# Patient Record
Sex: Male | Born: 2000 | Race: Black or African American | Hispanic: No | Marital: Single
Health system: Southern US, Community
[De-identification: ages and names within clinical notes are randomized; demographics above are authoritative.]

## PROBLEM LIST (undated history)

## (undated) ENCOUNTER — Emergency Department (HOSPITAL_COMMUNITY): Admission: EM | Payer: Medicaid Other | Source: Home / Self Care

---
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
## 2020-11-30 IMAGING — DX DG ANKLE COMPLETE 3+V*L*
3 series · 3 of 3 positions shown · non-contrast
Comparison: None.

CLINICAL DATA: Pain status post fall

EXAM:
LEFT ANKLE COMPLETE - 3+ VIEW

[ankle ap]
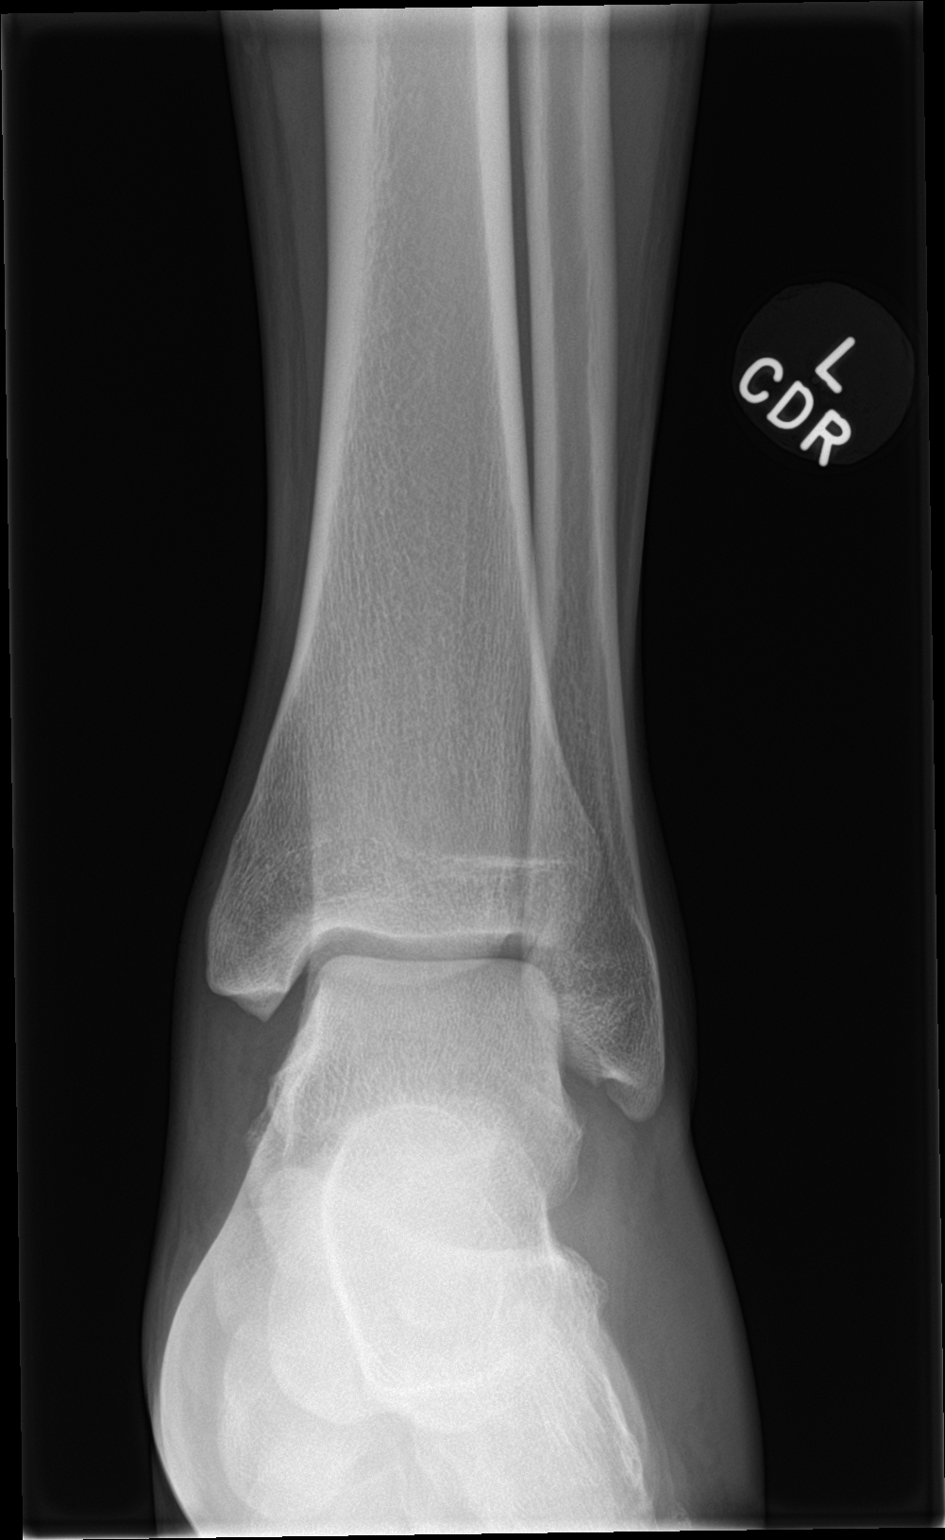

[ankle obl]
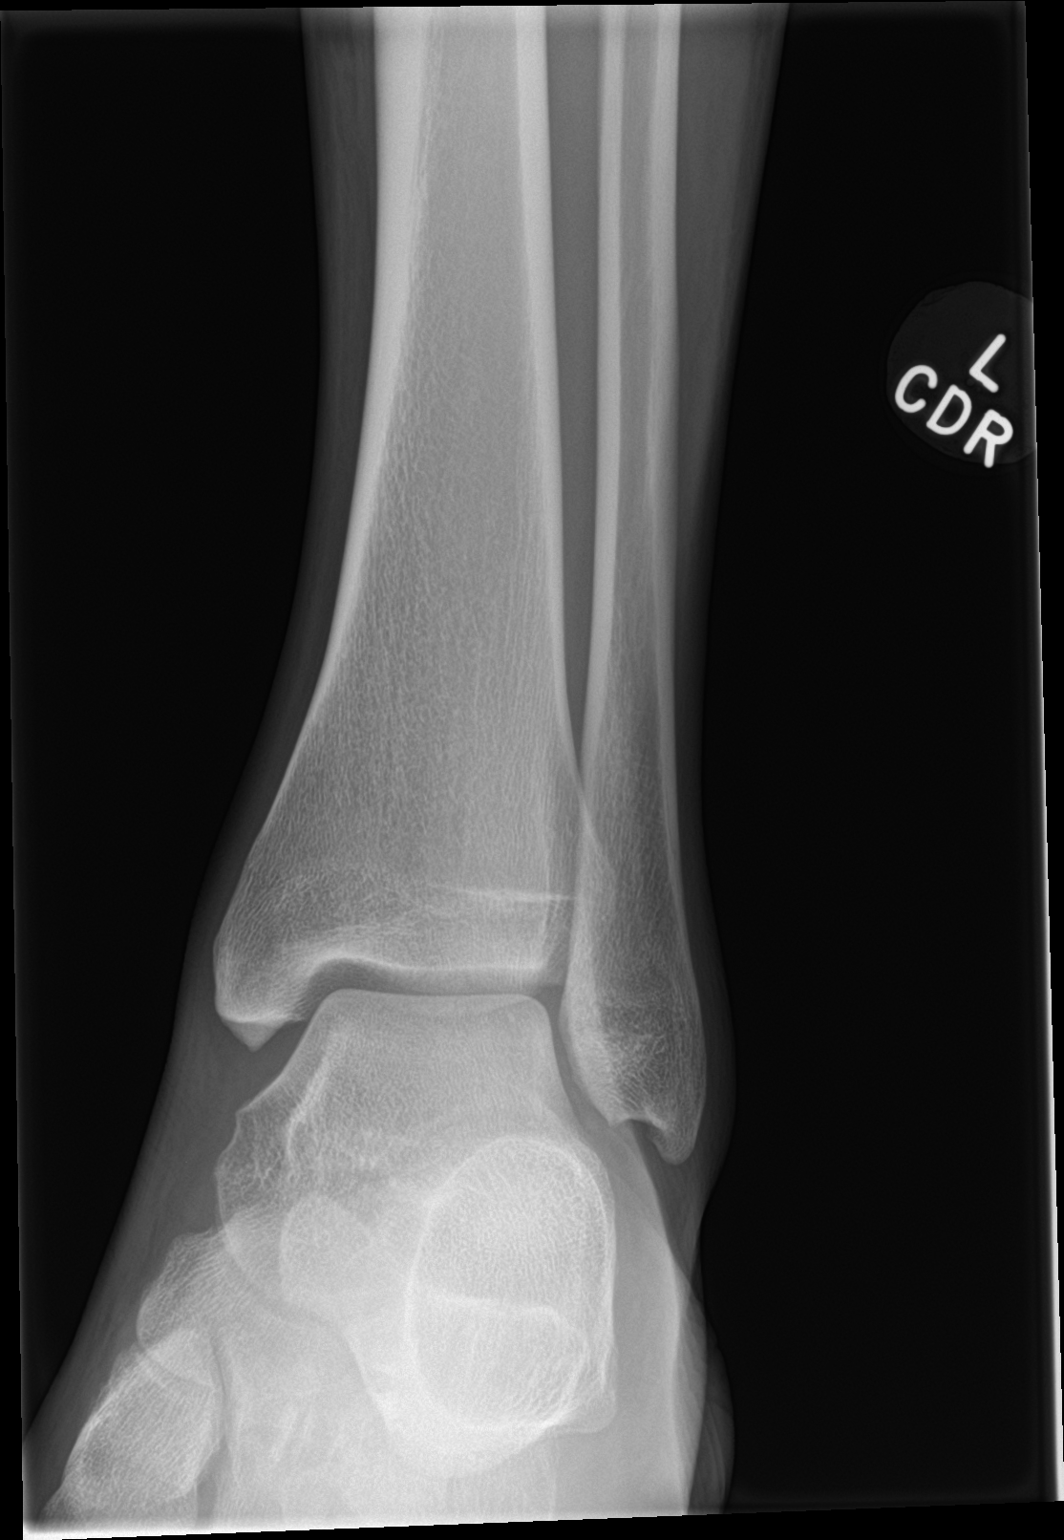

[ankle lat]
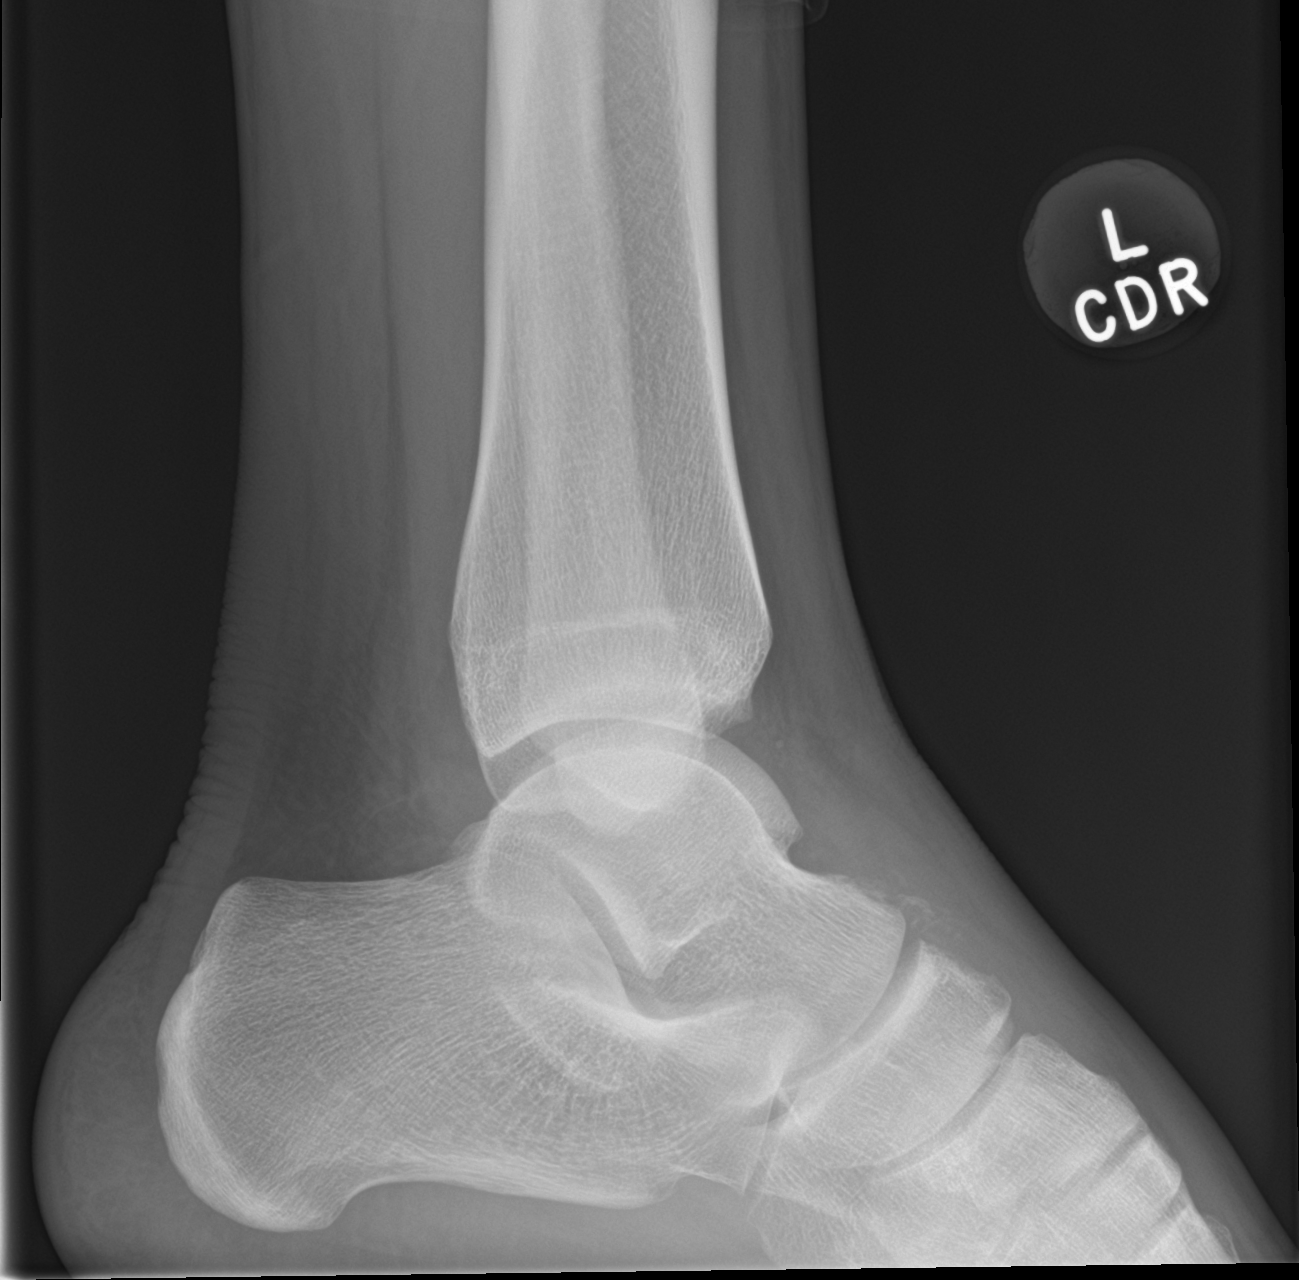

[3 of 3 positions shown; findings below may reference images not displayed]

FINDINGS: There is no acute displaced fracture or dislocation. There is some
fragmentation overlying the distal talus and proximal navicular,
likely related to an old remote injury.
IMPRESSION: No acute displaced fracture or dislocation.

## 2020-11-30 IMAGING — DX DG FOOT COMPLETE 3+V*L*
3 series · 3 of 3 positions shown · non-contrast
Comparison: None.

CLINICAL DATA: Pain

EXAM:
LEFT FOOT - COMPLETE 3+ VIEW

[foot ap]
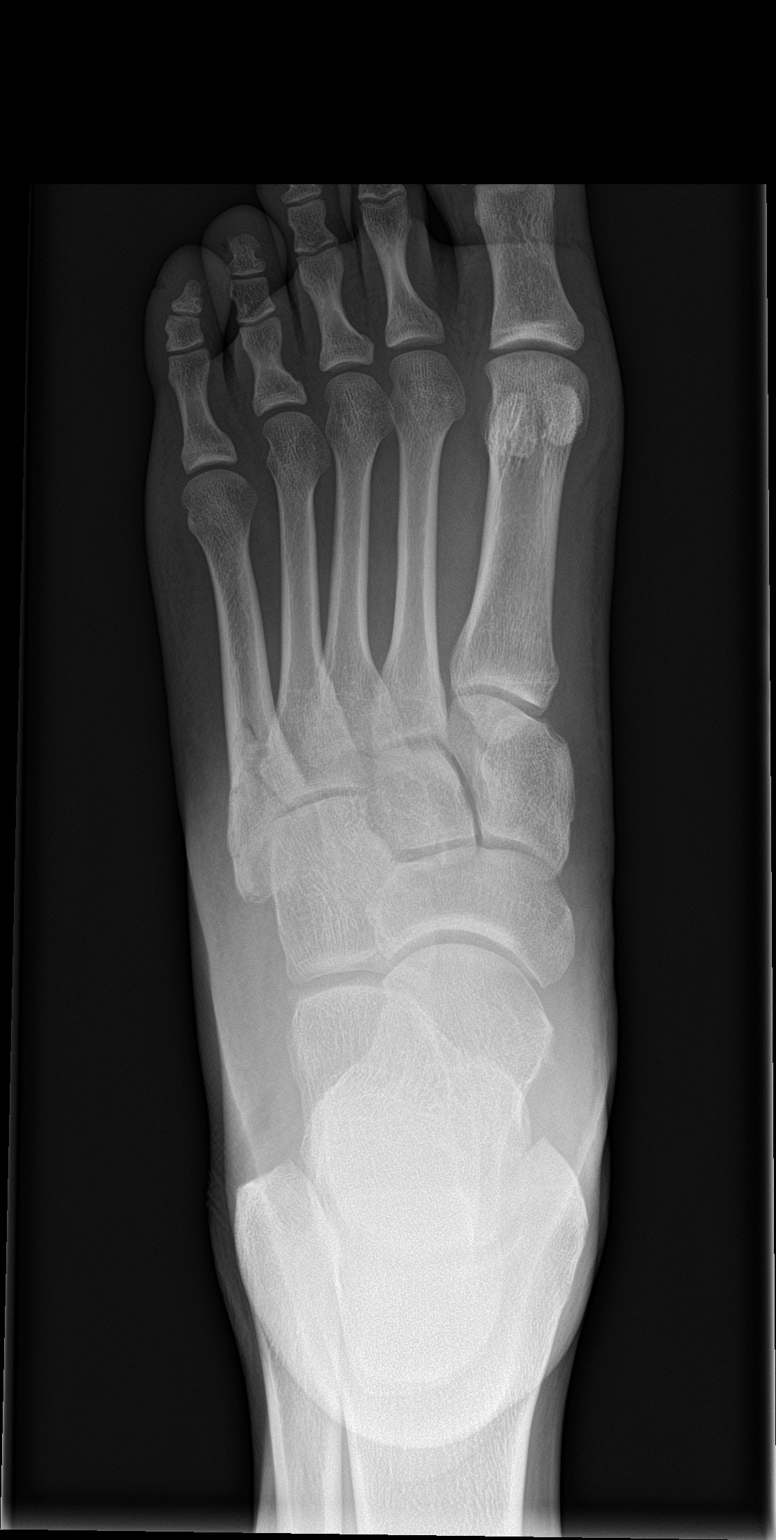

[foot obl]
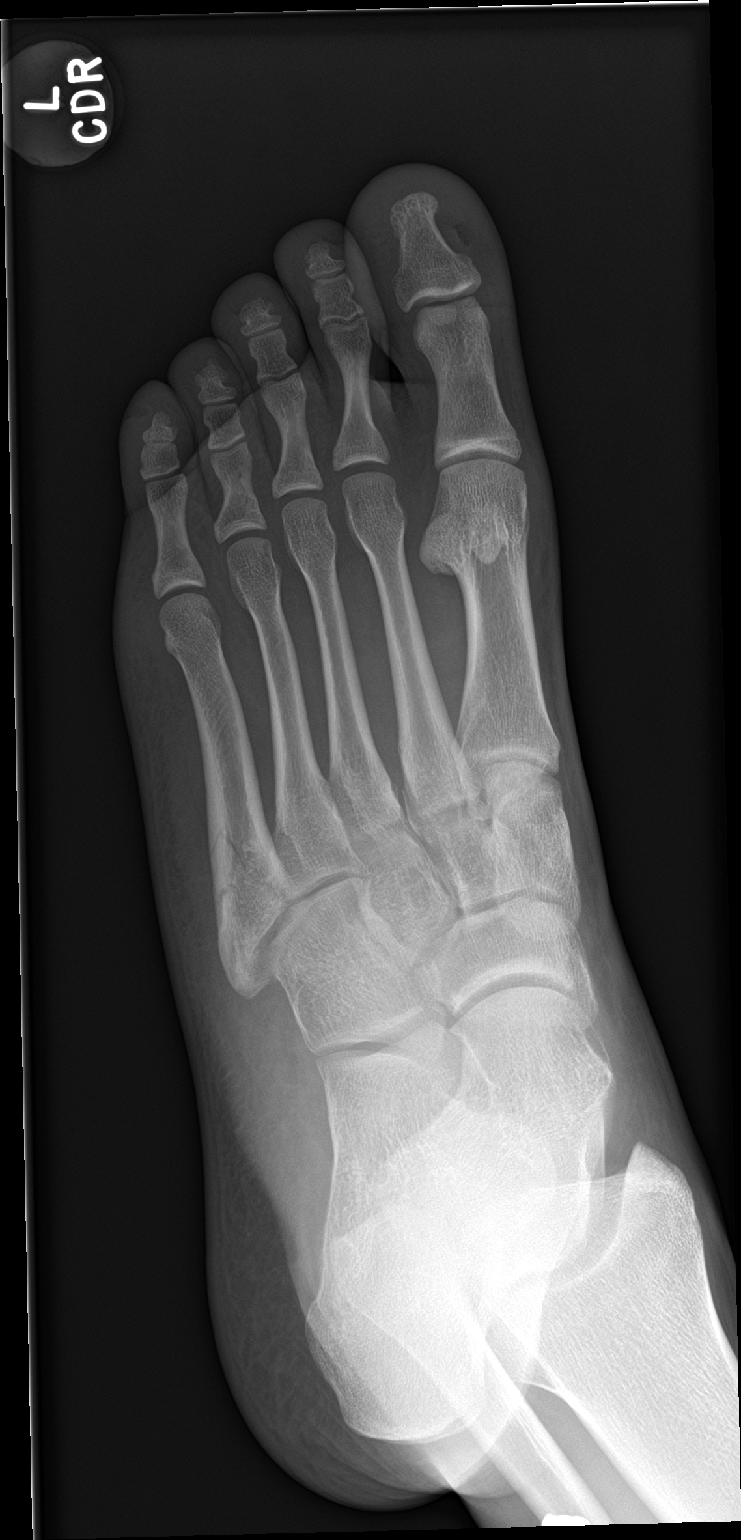

[foot lat]
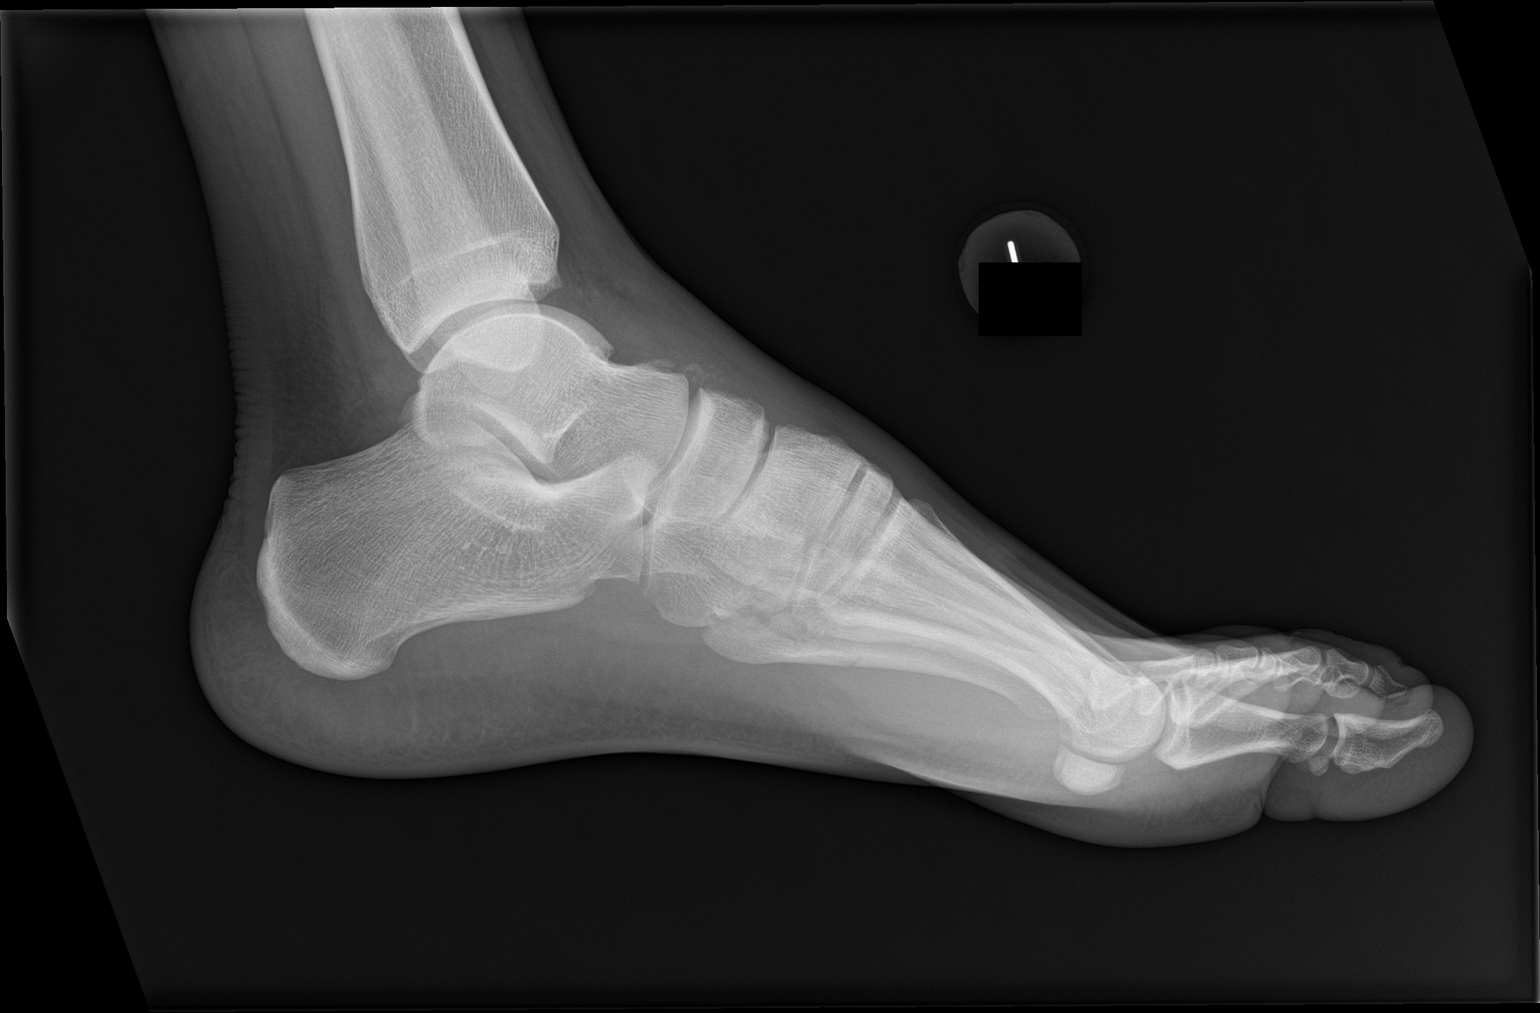

[3 of 3 positions shown; findings below may reference images not displayed]

FINDINGS: There is an acute nondisplaced fracture involving the proximal
aspect of the fifth metatarsal. There is surrounding soft tissue
swelling.
IMPRESSION: Acute nondisplaced fracture involving the proximal aspect of the
fifth metatarsal.

## 2022-01-19 ENCOUNTER — Emergency Department (HOSPITAL_BASED_OUTPATIENT_CLINIC_OR_DEPARTMENT_OTHER): Payer: Medicaid Other

## 2022-01-19 ENCOUNTER — Encounter (HOSPITAL_BASED_OUTPATIENT_CLINIC_OR_DEPARTMENT_OTHER): Payer: Self-pay | Admitting: Urology

## 2022-01-19 ENCOUNTER — Ambulatory Visit (HOSPITAL_BASED_OUTPATIENT_CLINIC_OR_DEPARTMENT_OTHER)
Admission: EM | Admit: 2022-01-19 | Discharge: 2022-01-20 | Disposition: A | Discharge status: Home / Self Care | Payer: Medicaid Other | Attending: General Surgery | Admitting: General Surgery

## 2022-01-19 ENCOUNTER — Other Ambulatory Visit: Payer: Self-pay

## 2022-01-19 DIAGNOSIS — K37 Unspecified appendicitis: Secondary | ICD-10-CM | POA: Diagnosis present

## 2022-01-19 DIAGNOSIS — K352 Acute appendicitis with generalized peritonitis, without perforation or abscess: Secondary | ICD-10-CM

## 2022-01-19 DIAGNOSIS — K3589 Other acute appendicitis without perforation or gangrene: Secondary | ICD-10-CM | POA: Diagnosis not present

## 2022-01-19 LAB — URINALYSIS, ROUTINE W REFLEX MICROSCOPIC
Bilirubin Urine: NEGATIVE
Glucose, UA: NEGATIVE mg/dL
Hgb urine dipstick: NEGATIVE
Ketones, ur: NEGATIVE mg/dL
Leukocytes,Ua: NEGATIVE
Nitrite: NEGATIVE
Protein, ur: NEGATIVE mg/dL
Specific Gravity, Urine: 1.02 (ref 1.005–1.030)
pH: 8.5 — ABNORMAL HIGH (ref 5.0–8.0)

## 2022-01-19 LAB — COMPREHENSIVE METABOLIC PANEL
ALT: 19 U/L (ref 0–44)
AST: 31 U/L (ref 15–41)
Albumin: 4.7 g/dL (ref 3.5–5.0)
Alkaline Phosphatase: 63 U/L (ref 38–126)
Anion gap: 14 (ref 5–15)
BUN: UNDETERMINED mg/dL (ref 6–20)
CO2: 20 mmol/L — ABNORMAL LOW (ref 22–32)
Calcium: 9.5 mg/dL (ref 8.9–10.3)
Chloride: 100 mmol/L (ref 98–111)
Creatinine, Ser: 1.05 mg/dL (ref 0.61–1.24)
GFR, Estimated: >60 mL/min (ref >60)
Glucose, Bld: 117 mg/dL — ABNORMAL HIGH (ref 70–99)
Potassium: 3.2 mmol/L — ABNORMAL LOW (ref 3.5–5.1)
Sodium: 134 mmol/L — ABNORMAL LOW (ref 135–145)
Total Bilirubin: 0.4 mg/dL (ref 0.3–1.2)
Total Protein: 8.9 g/dL — ABNORMAL HIGH (ref 6.5–8.1)

## 2022-01-19 LAB — CBC
HCT: 44.9 % (ref 39.0–52.0)
Hemoglobin: 15.3 g/dL (ref 13.0–17.0)
MCH: 30.9 pg (ref 26.0–34.0)
MCHC: 34.1 g/dL (ref 30.0–36.0)
MCV: 90.7 fL (ref 80.0–100.0)
Platelets: 200 10*3/uL (ref 150–400)
RBC: 4.95 MIL/uL (ref 4.22–5.81)
RDW: 12.1 % (ref 11.5–15.5)
WBC: 15.1 10*3/uL — ABNORMAL HIGH (ref 4.0–10.5)
nRBC: 0 % (ref 0.0–0.2)

## 2022-01-19 LAB — LIPASE, BLOOD: Lipase: 28 U/L (ref 11–51)

## 2022-01-19 MED ORDER — MORPHINE SULFATE (PF) 2 MG/ML IV SOLN
2.0000 mg | Freq: Once | INTRAVENOUS | Status: AC
  Administered 2022-01-19: 2 mg via INTRAVENOUS
  Filled 2022-01-19: qty 1

## 2022-01-19 MED ORDER — SODIUM CHLORIDE 0.9 % IV SOLN
2.0000 g | Freq: Once | INTRAVENOUS | Status: AC
  Administered 2022-01-19: 2 g via INTRAVENOUS
  Filled 2022-01-19: qty 20

## 2022-01-19 MED ORDER — METRONIDAZOLE 500 MG/100ML IV SOLN
500.0000 mg | Freq: Once | INTRAVENOUS | Status: AC
  Administered 2022-01-19: 500 mg via INTRAVENOUS
  Filled 2022-01-19: qty 100

## 2022-01-19 MED ORDER — IOHEXOL 300 MG/ML  SOLN
80.0000 mL | Freq: Once | INTRAMUSCULAR | Status: AC | PRN
  Administered 2022-01-19: 80 mL via INTRAVENOUS

## 2022-01-19 MED ORDER — SODIUM CHLORIDE 0.9 % IV BOLUS
1000.0000 mL | Freq: Once | INTRAVENOUS | Status: AC
  Administered 2022-01-19: 1000 mL via INTRAVENOUS

## 2022-01-19 MED ORDER — ONDANSETRON HCL 4 MG/2ML IJ SOLN
4.0000 mg | Freq: Once | INTRAMUSCULAR | Status: AC
  Administered 2022-01-19: 4 mg via INTRAVENOUS
  Filled 2022-01-19: qty 2

## 2022-01-19 MED ORDER — ONDANSETRON 4 MG PO TBDP
4.0000 mg | ORAL_TABLET | Freq: Once | ORAL | Status: AC | PRN
  Administered 2022-01-19: 4 mg via ORAL
  Filled 2022-01-19: qty 1

## 2022-01-19 MED ORDER — MORPHINE SULFATE (PF) 4 MG/ML IV SOLN
4.0000 mg | Freq: Once | INTRAVENOUS | Status: AC
  Administered 2022-01-19: 4 mg via INTRAVENOUS
  Filled 2022-01-19: qty 1

## 2022-01-19 NOTE — ED Provider Notes (Signed)
[facility] Provider Note   CSN: [id number] Arrival date & time: 01/19/22  1607     History  Chief Complaint  Patient presents with   Emesis    Daniel Rubio is a 22 y.o. male.  Patient with no pertinent past medical history presents today with complaints of nausea, vomiting, and abdominal pain.  He states that same began this morning.  He denies any history of similar previously.  Pain is located in the periumbilical region and radiates to his right lower quadrant.  Denies any associated diarrhea.  He has had a bowel movement today and is passing flatus normally.  Denies any history of abdominal surgeries.  Denies fevers or chills.  The history is provided by the patient. No language interpreter was used.  Emesis Associated symptoms: abdominal pain        Home Medications Prior to Admission medications   Medication Sig Start Date End Date Taking? Authorizing Provider  HYDROcodone-acetaminophen (NORCO/VICODIN) 5-325 MG tablet Take 1 tablet by mouth every 6 (six) hours as needed. 01/24/19   Caccavale, Sophia, PA-C      Allergies    Patient has no known allergies.    Review of Systems   Review of Systems  Gastrointestinal:  Positive for abdominal pain, nausea and vomiting.  All other systems reviewed and are negative.   Physical Exam Updated Vital Signs BP (!) 142/80   Pulse 89   Temp (!) 97.4 F (36.3 C) (Oral)   Resp 17   Ht 6\' 2"  (1.88 m)   Wt 72.6 kg   SpO2 100%   BMI 20.54 kg/m  Physical Exam Vitals and nursing note reviewed.  Constitutional:      General: He is not in acute distress.    Appearance: Normal appearance. He is normal weight. He is not ill-appearing, toxic-appearing or diaphoretic.  HENT:     Head: Normocephalic and atraumatic.  Cardiovascular:     Rate and Rhythm: Normal rate.  Pulmonary:     Effort: Pulmonary effort is normal. No respiratory distress.  Abdominal:     General: Abdomen is flat.      Palpations: Abdomen is soft.     Tenderness: There is abdominal tenderness. There is rebound.  Musculoskeletal:        General: Normal range of motion.     Cervical back: Normal range of motion.  Skin:    General: Skin is warm and dry.  Neurological:     General: No focal deficit present.     Mental Status: He is alert.  Psychiatric:        Mood and Affect: Mood normal.        Behavior: Behavior normal.    ED Results / Procedures / Treatments   Labs (all labs ordered are listed, but only abnormal results are displayed) Labs Reviewed  COMPREHENSIVE METABOLIC PANEL - Abnormal; Notable for the following components:      Result Value   Sodium 134 (*)    Potassium 3.2 (*)    CO2 20 (*)    Glucose, Bld 117 (*)    Total Protein 8.9 (*)    All other components within normal limits  CBC - Abnormal; Notable for the following components:   WBC 15.1 (*)    All other components within normal limits  URINALYSIS, ROUTINE W REFLEX MICROSCOPIC - Abnormal; Notable for the following components:   pH 8.5 (*)    All other components within normal limits  LIPASE, BLOOD  EKG None  Radiology CT ABDOMEN PELVIS W CONTRAST  Result Date: 01/19/2022 CLINICAL DATA:  Abdominal pain, nausea and vomiting EXAM: CT ABDOMEN AND PELVIS WITH CONTRAST TECHNIQUE: Multidetector CT imaging of the abdomen and pelvis was performed using the standard protocol following bolus administration of intravenous contrast. RADIATION DOSE REDUCTION: This exam was performed according to the departmental dose-optimization program which includes automated exposure control, adjustment of the mA and/or kV according to patient size and/or use of iterative reconstruction technique. CONTRAST:  70mL OMNIPAQUE IOHEXOL 300 MG/ML  SOLN COMPARISON:  None Available. FINDINGS: Lower chest: No acute pleural or parenchymal lung disease. Hepatobiliary: No focal liver abnormality is seen. No gallstones, gallbladder wall thickening, or biliary  dilatation. Pancreas: Unremarkable. No pancreatic ductal dilatation or surrounding inflammatory changes. Spleen: Normal in size without focal abnormality. Adrenals/Urinary Tract: Adrenal glands are unremarkable. Kidneys are normal, without renal calculi, focal lesion, or hydronephrosis. Bladder is unremarkable. Stomach/Bowel: There is a dilated inflamed appendix within the right lower quadrant, measuring up to 13 mm reference image 62/2. Appendicolith is identified, with appendiceal mucosal enhancement and mild periappendiceal fat stranding consistent with acute uncomplicated appendicitis. No perforation, fluid collection, or abscess. No bowel obstruction or ileus. Vascular/Lymphatic: No significant vascular findings are present. No enlarged abdominal or pelvic lymph nodes. Reproductive: Prostate is unremarkable. Other: No free fluid or free intraperitoneal gas. No abdominal wall hernia. Musculoskeletal: No acute or destructive bony lesions. Reconstructed images demonstrate no additional findings. IMPRESSION: 1. Acute uncomplicated appendicitis. No perforation, fluid collection, or abscess. Electronically Signed   By: Sharlet Salina M.D.   On: 01/19/2022 21:43    Procedures Procedures    Medications Ordered in ED Medications  ondansetron (ZOFRAN-ODT) disintegrating tablet 4 mg (4 mg Oral Given 01/19/22 1701)  sodium chloride 0.9 % bolus 1,000 mL (1,000 mLs Intravenous New Bag/Given 01/19/22 2117)  iohexol (OMNIPAQUE) 300 MG/ML solution 80 mL (80 mLs Intravenous Contrast Given 01/19/22 2135)    ED Course/ Medical Decision Making/ A&P                           Medical Decision Making Amount and/or Complexity of Data Reviewed Labs: ordered. Radiology: ordered.  Risk Prescription drug management. Decision regarding hospitalization.   This patient is a 22 y.o. male who presents to the ED for concern of abdominal pain, nausea, and vomiting, this involves an extensive number of treatment options, and is  a complaint that carries with it a high risk of complications and morbidity. The emergent differential diagnosis prior to evaluation includes, but is not limited to,  The differential diagnosis for generalized abdominal pain includes, but is not limited to AAA, gastroenteritis, appendicitis, Bowel obstruction, Bowel perforation. Gastroparesis, DKA, Hernia, Inflammatory bowel disease, mesenteric ischemia, pancreatitis, peritonitis SBP, volvulus.  This is not an exhaustive differential.   Past Medical History / Co-morbidities / Social History: none  Physical Exam: Physical exam performed. The pertinent findings include: Periumbilical and RLQ abdominal tenderness to palpation  Lab Tests: I ordered, and personally interpreted labs.  The pertinent results include:  Na 134, K 3.2, WBC 15.1.   Imaging Studies: I ordered imaging studies including CT abdomen pelvis. I independently visualized and interpreted imaging which showed   1. Acute uncomplicated appendicitis. No perforation, fluid collection, or abscess.  I agree with the radiologist interpretation.   Medications: I ordered medication including rocephin, flagyl, fluids, morphine, zofran  for appendicitis, dehydration, pain, nausea/vomiting. Reevaluation of the patient after these medicines showed that  the patient improved. I have reviewed the patients home medicines and have made adjustments as needed.  Consultations Obtained: I requested consultation with the general surgery Dr. Marlou Starks,  and discussed lab and imaging findings as well as pertinent plan - they recommend: transfer to [facility] for evaluation. NPO   Disposition:  Patient presents today with complaints of right lower quadrant pain with nausea and vomiting since this morning.  He is afebrile, nontoxic-appearing, and in no acute distress with reassuring vital signs.  CT imaging reveals acute appendicitis.  Discussed with general surgery who will see patient at [facility] long  ED.  Discussed plan with patient who is understanding and amenable.  Discussed transfer with [facility], EDP Dr. Regenia Skeeter who accepts patient for transfer.   I discussed this case with my attending physician Dr. Ronnald Nian who cosigned this note including patient's presenting symptoms, physical exam, and planned diagnostics and interventions. Attending physician stated agreement with plan or made changes to plan which were implemented.     Final Clinical Impression(s) / ED Diagnoses Final diagnoses:  Acute appendicitis with generalized peritonitis without gangrene, perforation, or abscess    Rx / DC Orders ED Discharge Orders     None         Nestor Lewandowsky 01/19/22 2247    Lennice Sites, DO 01/19/22 2254

## 2022-01-19 NOTE — ED Triage Notes (Signed)
Pt states abdominal pain since this am  N/V , denies diarrhea Denies fever

## 2022-01-19 NOTE — ED Notes (Signed)
Pt vomited green clear liquid x2.  Given water to rinse and spit and cool washcloth. Medicated. Mother remains at the bedside.  Pt ambulates to the bathroom to void.

## 2022-01-19 NOTE — ED Notes (Signed)
Triage RN attempted IV placement x2 without success, CT tech attempted IV placement without success.  RT to place [place] IV

## 2022-01-19 NOTE — ED Notes (Signed)
Epic DM to charge RN at [employer].  Pt aware that we are awaiting transport to [facility].

## 2022-01-19 NOTE — ED Notes (Signed)
Pt is in CT

## 2022-01-20 ENCOUNTER — Emergency Department (HOSPITAL_COMMUNITY): Payer: Medicaid Other | Admitting: Anesthesiology

## 2022-01-20 ENCOUNTER — Emergency Department (HOSPITAL_BASED_OUTPATIENT_CLINIC_OR_DEPARTMENT_OTHER): Payer: Medicaid Other | Admitting: Anesthesiology

## 2022-01-20 ENCOUNTER — Encounter (HOSPITAL_COMMUNITY)
Admission: EM | Disposition: A | Discharge status: Home / Self Care | Payer: Self-pay | Source: Home / Self Care | Attending: Emergency Medicine

## 2022-01-20 ENCOUNTER — Other Ambulatory Visit: Payer: Self-pay

## 2022-01-20 DIAGNOSIS — K3580 Unspecified acute appendicitis: Secondary | ICD-10-CM

## 2022-01-20 DIAGNOSIS — K37 Unspecified appendicitis: Secondary | ICD-10-CM | POA: Diagnosis present

## 2022-01-20 HISTORY — PX: LAPAROSCOPIC APPENDECTOMY: SHX408

## 2022-01-20 SURGERY — APPENDECTOMY, LAPAROSCOPIC
Anesthesia: General | Site: Abdomen

## 2022-01-20 MED ORDER — FENTANYL CITRATE (PF) 100 MCG/2ML IJ SOLN
INTRAMUSCULAR | Status: AC
  Filled 2022-01-20: qty 2

## 2022-01-20 MED ORDER — MIDAZOLAM HCL 2 MG/2ML IJ SOLN
INTRAMUSCULAR | Status: AC
  Filled 2022-01-20: qty 2

## 2022-01-20 MED ORDER — LACTATED RINGERS IV SOLN: INTRAVENOUS | Status: DC | PRN

## 2022-01-20 MED ORDER — OXYCODONE HCL 5 MG PO TABS: 5.0000 mg | ORAL_TABLET | ORAL | Status: DC | PRN

## 2022-01-20 MED ORDER — ACETAMINOPHEN 10 MG/ML IV SOLN
INTRAVENOUS | Status: DC | PRN
  Administered 2022-01-20: 1000 mg via INTRAVENOUS

## 2022-01-20 MED ORDER — LACTATED RINGERS IR SOLN
Status: DC | PRN
  Administered 2022-01-20: 1000 mL

## 2022-01-20 MED ORDER — DEXAMETHASONE SODIUM PHOSPHATE 10 MG/ML IJ SOLN
INTRAMUSCULAR | Status: AC
  Filled 2022-01-20: qty 1

## 2022-01-20 MED ORDER — ROCURONIUM BROMIDE 10 MG/ML (PF) SYRINGE
PREFILLED_SYRINGE | INTRAVENOUS | Status: DC | PRN
  Administered 2022-01-20: 40 mg via INTRAVENOUS

## 2022-01-20 MED ORDER — ONDANSETRON HCL 4 MG/2ML IJ SOLN: 4.0000 mg | Freq: Four times a day (QID) | INTRAMUSCULAR | Status: DC | PRN

## 2022-01-20 MED ORDER — MORPHINE SULFATE (PF) 4 MG/ML IV SOLN
4.0000 mg | Freq: Once | INTRAVENOUS | Status: AC
  Administered 2022-01-20: 4 mg via INTRAVENOUS
  Filled 2022-01-20: qty 1

## 2022-01-20 MED ORDER — OXYCODONE HCL 5 MG PO TABS
ORAL_TABLET | ORAL | Status: AC
  Filled 2022-01-20: qty 1

## 2022-01-20 MED ORDER — SUCCINYLCHOLINE CHLORIDE 200 MG/10ML IV SOSY
PREFILLED_SYRINGE | INTRAVENOUS | Status: DC | PRN
  Administered 2022-01-20: 140 mg via INTRAVENOUS

## 2022-01-20 MED ORDER — MIDAZOLAM HCL 2 MG/2ML IJ SOLN: 0.5000 mg | Freq: Once | INTRAMUSCULAR | Status: DC | PRN

## 2022-01-20 MED ORDER — ONDANSETRON HCL 4 MG/2ML IJ SOLN
INTRAMUSCULAR | Status: AC
  Filled 2022-01-20: qty 2

## 2022-01-20 MED ORDER — SODIUM CHLORIDE 0.9 % IV SOLN: INTRAVENOUS | Status: DC

## 2022-01-20 MED ORDER — PANTOPRAZOLE SODIUM 40 MG IV SOLR: 40.0000 mg | Freq: Every day | INTRAVENOUS | Status: DC

## 2022-01-20 MED ORDER — PROPOFOL 10 MG/ML IV BOLUS
INTRAVENOUS | Status: DC | PRN
  Administered 2022-01-20: 200 mg via INTRAVENOUS

## 2022-01-20 MED ORDER — 0.9 % SODIUM CHLORIDE (POUR BTL) OPTIME
TOPICAL | Status: DC | PRN
  Administered 2022-01-20: 1000 mL

## 2022-01-20 MED ORDER — DEXTROSE 5 % IV SOLN
INTRAVENOUS | Status: DC | PRN
  Administered 2022-01-20: 2 g via INTRAVENOUS

## 2022-01-20 MED ORDER — PROMETHAZINE HCL 25 MG/ML IJ SOLN: 6.2500 mg | INTRAMUSCULAR | Status: DC | PRN

## 2022-01-20 MED ORDER — METHOCARBAMOL 500 MG PO TABS: 500.0000 mg | ORAL_TABLET | Freq: Four times a day (QID) | ORAL | Status: DC | PRN

## 2022-01-20 MED ORDER — HYDROMORPHONE HCL 1 MG/ML IJ SOLN: 0.2500 mg | INTRAMUSCULAR | Status: DC | PRN

## 2022-01-20 MED ORDER — PROPOFOL 10 MG/ML IV BOLUS
INTRAVENOUS | Status: AC
  Filled 2022-01-20: qty 20

## 2022-01-20 MED ORDER — OXYCODONE HCL 5 MG PO TABS
5.0000 mg | ORAL_TABLET | Freq: Once | ORAL | Status: AC | PRN
  Administered 2022-01-20: 5 mg via ORAL

## 2022-01-20 MED ORDER — SODIUM CHLORIDE 0.9 % IV SOLN
INTRAVENOUS | Status: AC
  Filled 2022-01-20: qty 20

## 2022-01-20 MED ORDER — ACETAMINOPHEN 10 MG/ML IV SOLN
INTRAVENOUS | Status: AC
  Filled 2022-01-20: qty 100

## 2022-01-20 MED ORDER — OXYCODONE HCL 5 MG/5ML PO SOLN: 5.0000 mg | Freq: Once | ORAL | Status: AC | PRN

## 2022-01-20 MED ORDER — FENTANYL CITRATE (PF) 100 MCG/2ML IJ SOLN
INTRAMUSCULAR | Status: DC | PRN
  Administered 2022-01-20: 50 ug via INTRAVENOUS
  Administered 2022-01-20: 100 ug via INTRAVENOUS

## 2022-01-20 MED ORDER — METRONIDAZOLE 500 MG/100ML IV SOLN
INTRAVENOUS | Status: AC
  Filled 2022-01-20: qty 100

## 2022-01-20 MED ORDER — SUGAMMADEX SODIUM 200 MG/2ML IV SOLN
INTRAVENOUS | Status: DC | PRN
  Administered 2022-01-20: 150 mg via INTRAVENOUS

## 2022-01-20 MED ORDER — ONDANSETRON 4 MG PO TBDP: 4.0000 mg | ORAL_TABLET | Freq: Four times a day (QID) | ORAL | Status: DC | PRN

## 2022-01-20 MED ORDER — DEXAMETHASONE SODIUM PHOSPHATE 10 MG/ML IJ SOLN
INTRAMUSCULAR | Status: DC | PRN
  Administered 2022-01-20: 5 mg via INTRAVENOUS

## 2022-01-20 MED ORDER — BUPIVACAINE-EPINEPHRINE (PF) 0.5% -1:200000 IJ SOLN
INTRAMUSCULAR | Status: AC
  Filled 2022-01-20: qty 30

## 2022-01-20 MED ORDER — LIDOCAINE HCL (PF) 2 % IJ SOLN
INTRAMUSCULAR | Status: AC
  Filled 2022-01-20: qty 5

## 2022-01-20 MED ORDER — SUCCINYLCHOLINE CHLORIDE 200 MG/10ML IV SOSY
PREFILLED_SYRINGE | INTRAVENOUS | Status: AC
  Filled 2022-01-20: qty 10

## 2022-01-20 MED ORDER — MORPHINE SULFATE (PF) 2 MG/ML IV SOLN: 1.0000 mg | INTRAVENOUS | Status: DC | PRN

## 2022-01-20 MED ORDER — ROCURONIUM BROMIDE 10 MG/ML (PF) SYRINGE
PREFILLED_SYRINGE | INTRAVENOUS | Status: AC
  Filled 2022-01-20: qty 10

## 2022-01-20 MED ORDER — METRONIDAZOLE 500 MG/100ML IV SOLN
INTRAVENOUS | Status: DC | PRN
  Administered 2022-01-20: 500 mg via INTRAVENOUS

## 2022-01-20 MED ORDER — ONDANSETRON HCL 4 MG/2ML IJ SOLN
4.0000 mg | Freq: Once | INTRAMUSCULAR | Status: AC
  Administered 2022-01-20: 4 mg via INTRAVENOUS
  Filled 2022-01-20: qty 2

## 2022-01-20 MED ORDER — LIDOCAINE 2% (20 MG/ML) 5 ML SYRINGE
INTRAMUSCULAR | Status: DC | PRN
  Administered 2022-01-20: 40 mg via INTRAVENOUS

## 2022-01-20 MED ORDER — ENOXAPARIN SODIUM 30 MG/0.3ML IJ SOSY: 30.0000 mg | PREFILLED_SYRINGE | INTRAMUSCULAR | Status: DC

## 2022-01-20 MED ORDER — OXYCODONE HCL 5 MG PO TABS: 5.0000 mg | ORAL_TABLET | Freq: Four times a day (QID) | ORAL | 0 refills | Status: AC | PRN

## 2022-01-20 MED ORDER — MEPERIDINE HCL 50 MG/ML IJ SOLN: 6.2500 mg | INTRAMUSCULAR | Status: DC | PRN

## 2022-01-20 MED ORDER — MIDAZOLAM HCL 2 MG/2ML IJ SOLN
INTRAMUSCULAR | Status: DC | PRN
  Administered 2022-01-20: 2 mg via INTRAVENOUS

## 2022-01-20 MED ORDER — BUPIVACAINE-EPINEPHRINE 0.25% -1:200000 IJ SOLN
INTRAMUSCULAR | Status: DC | PRN
  Administered 2022-01-20: 20 mL

## 2022-01-20 MED ORDER — ONDANSETRON HCL 4 MG/2ML IJ SOLN
INTRAMUSCULAR | Status: DC | PRN
  Administered 2022-01-20: 4 mg via INTRAVENOUS

## 2022-01-20 SURGICAL SUPPLY — 40 items
ADH SKN CLS APL DERMABOND .7 (GAUZE/BANDAGES/DRESSINGS) ×1
APPLIER CLIP ROT 10 11.4 M/L (STAPLE)
APR CLP MED LRG 11.4X10 (STAPLE)
BAG COUNTER SPONGE SURGICOUNT (BAG) IMPLANT
CABLE HIGH FREQUENCY MONO STRZ (ELECTRODE) ×1 IMPLANT
CHLORAPREP W/TINT 26 (MISCELLANEOUS) ×1 IMPLANT
CLIP APPLIE ROT 10 11.4 M/L (STAPLE) IMPLANT
CUTTER FLEX LINEAR 45M (STAPLE) IMPLANT
DERMABOND ADVANCED .7 DNX12 (GAUZE/BANDAGES/DRESSINGS) ×1 IMPLANT
ELECT REM PT RETURN 15FT ADLT (MISCELLANEOUS) ×1 IMPLANT
ENDOLOOP SUT PDS II  0 18 (SUTURE)
ENDOLOOP SUT PDS II 0 18 (SUTURE) IMPLANT
GLOVE BIO SURGEON STRL SZ7.5 (GLOVE) ×1 IMPLANT
GOWN STRL REUS W/ TWL LRG LVL3 (GOWN DISPOSABLE) IMPLANT
GOWN STRL REUS W/TWL LRG LVL3 (GOWN DISPOSABLE)
IRRIG SUCT STRYKERFLOW 2 WTIP (MISCELLANEOUS) ×1
IRRIGATION SUCT STRKRFLW 2 WTP (MISCELLANEOUS) ×1 IMPLANT
KIT BASIN OR (CUSTOM PROCEDURE TRAY) ×1 IMPLANT
KIT TURNOVER KIT A (KITS) IMPLANT
PENCIL SMOKE EVACUATOR (MISCELLANEOUS) IMPLANT
RELOAD 45 VASCULAR/THIN (ENDOMECHANICALS) IMPLANT
RELOAD STAPLE 45 2.5 WHT GRN (ENDOMECHANICALS) IMPLANT
RELOAD STAPLE 45 3.5 BLU ETS (ENDOMECHANICALS) IMPLANT
RELOAD STAPLE 60 BLK VRY/THCK (STAPLE) IMPLANT
RELOAD STAPLE TA45 3.5 REG BLU (ENDOMECHANICALS) IMPLANT
RELOAD STAPLER 60MM BLK (STAPLE) ×1 IMPLANT
SCISSORS LAP 5X35 DISP (ENDOMECHANICALS) ×1 IMPLANT
SET TUBE SMOKE EVAC HIGH FLOW (TUBING) ×1 IMPLANT
SHEARS HARMONIC ACE PLUS 36CM (ENDOMECHANICALS) ×1 IMPLANT
SPIKE FLUID TRANSFER (MISCELLANEOUS) ×1 IMPLANT
STAPLE ECHEON FLEX 60 POW ENDO (STAPLE) IMPLANT
STAPLER RELOAD 60MM BLK (STAPLE) ×1
SUT MNCRL AB 4-0 PS2 18 (SUTURE) ×1 IMPLANT
SYS BAG RETRIEVAL 10MM (BASKET) ×1
SYSTEM BAG RETRIEVAL 10MM (BASKET) ×1 IMPLANT
TOWEL OR 17X26 10 PK STRL BLUE (TOWEL DISPOSABLE) ×1 IMPLANT
TRAY FOLEY MTR SLVR 16FR STAT (SET/KITS/TRAYS/PACK) IMPLANT
TRAY LAPAROSCOPIC (CUSTOM PROCEDURE TRAY) ×1 IMPLANT
TROCAR BALLN 12MMX100 BLUNT (TROCAR) ×1 IMPLANT
TROCAR Z-THREAD OPTICAL 5X100M (TROCAR) ×1 IMPLANT

## 2022-01-20 NOTE — H&P (Signed)
Daniel Rubio is an 22 y.o. male.   Chief Complaint: abd pain HPI: The patient is a 22 year old black male who began having some abdominal discomfort on Thursday night.  The pain worsened on Friday and he went to the emergency department.  The pain was associated with some nausea and vomiting.  He describes the pain more as epigastric but he does have some mild right lower quadrant tenderness as well.  He denies any fevers or chills.  A CT scan was consistent with acute appendicitis.  He is otherwise in good health.  He does occasionally smoke.  History reviewed. No pertinent past medical history.  History reviewed. No pertinent surgical history.  History reviewed. No pertinent family history. Social History:  reports that he has never smoked. He has never used smokeless tobacco. He reports current alcohol use. He reports current drug use. Drug: Marijuana.  Allergies: No Known Allergies  (Not in a hospital admission)   Results for orders placed or performed during the hospital encounter of 01/19/22 (from the past 48 hour(s))  Lipase, blood     Status: None   Collection Time: 01/19/22  4:57 PM  Result Value Ref Range   Lipase 28 11 - 51 U/L    Comment: Performed at [facility], [address] [place], [place] [address]  Comprehensive metabolic panel     Status: Abnormal   Collection Time: 01/19/22  4:57 PM  Result Value Ref Range   Sodium 134 (L) 135 - 145 mmol/L   Potassium 3.2 (L) 3.5 - 5.1 mmol/L   Chloride 100 98 - 111 mmol/L   CO2 20 (L) 22 - 32 mmol/L   Glucose, Bld 117 (H) 70 - 99 mg/dL    Comment: Glucose reference range applies only to samples taken after fasting for at least 8 hours.   BUN QUANTITY NOT SUFFICIENT, UNABLE TO PERFORM TEST 6 - 20 mg/dL    Comment:  PER GOUGE,S RN @1744  1.5.24 EDENSCA   Creatinine, Ser 1.05 0.61 - 1.24 mg/dL   Calcium 9.5 8.9 - 10.3 mg/dL   Total Protein 8.9 (H) 6.5 - 8.1 g/dL   Albumin 4.7 3.5 - 5.0 g/dL   AST 31 15 - 41 U/L    ALT 19 0 - 44 U/L   Alkaline Phosphatase 63 38 - 126 U/L   Total Bilirubin 0.4 0.3 - 1.2 mg/dL   GFR, Estimated >60 >60 mL/min    Comment: (NOTE) Calculated using the CKD-EPI Creatinine Equation (2021)    Anion gap 14 5 - 15    Comment: Performed at [facility], [address] [place], [place] [address]  CBC     Status: Abnormal   Collection Time: 01/19/22  4:57 PM  Result Value Ref Range   WBC 15.1 (H) 4.0 - 10.5 K/uL   RBC 4.95 4.22 - 5.81 MIL/uL   Hemoglobin 15.3 13.0 - 17.0 g/dL   HCT 44.9 39.0 - 52.0 %   MCV 90.7 80.0 - 100.0 fL   MCH 30.9 26.0 - 34.0 pg   MCHC 34.1 30.0 - 36.0 g/dL   RDW 12.1 11.5 - 15.5 %   Platelets 200 150 - 400 K/uL   nRBC 0.0 0.0 - 0.2 %    Comment: Performed at [facility], [address] [place], [place] [address]  Urinalysis, Routine w reflex microscopic     Status: Abnormal   Collection Time: 01/19/22  8:40 PM  Result Value Ref  Range   Color, Urine YELLOW YELLOW   APPearance CLEAR CLEAR   Specific Gravity, Urine 1.020 1.005 - 1.030   pH 8.5 (H) 5.0 - 8.0   Glucose, UA NEGATIVE NEGATIVE mg/dL   Hgb urine dipstick NEGATIVE NEGATIVE   Bilirubin Urine NEGATIVE NEGATIVE   Ketones, ur NEGATIVE NEGATIVE mg/dL   Protein, ur NEGATIVE NEGATIVE mg/dL   Nitrite NEGATIVE NEGATIVE   Leukocytes,Ua NEGATIVE NEGATIVE    Comment: Microscopic not done on urines with negative protein, blood, leukocytes, nitrite, or glucose < 500 mg/dL. Performed at [facility], [address] [place], [place] [address]    CT ABDOMEN PELVIS W CONTRAST  Result Date: 01/19/2022 CLINICAL DATA:  Abdominal pain, nausea and vomiting EXAM: CT ABDOMEN AND PELVIS WITH CONTRAST TECHNIQUE: Multidetector CT imaging of the abdomen and pelvis was performed using the standard protocol following bolus administration of intravenous contrast. RADIATION DOSE REDUCTION: This exam was performed according to the departmental dose-optimization program which  includes automated exposure control, adjustment of the mA and/or kV according to patient size and/or use of iterative reconstruction technique. CONTRAST:  63mL OMNIPAQUE IOHEXOL 300 MG/ML  SOLN COMPARISON:  None Available. FINDINGS: Lower chest: No acute pleural or parenchymal lung disease. Hepatobiliary: No focal liver abnormality is seen. No gallstones, gallbladder wall thickening, or biliary dilatation. Pancreas: Unremarkable. No pancreatic ductal dilatation or surrounding inflammatory changes. Spleen: Normal in size without focal abnormality. Adrenals/Urinary Tract: Adrenal glands are unremarkable. Kidneys are normal, without renal calculi, focal lesion, or hydronephrosis. Bladder is unremarkable. Stomach/Bowel: There is a dilated inflamed appendix within the right lower quadrant, measuring up to 13 mm reference image 62/2. Appendicolith is identified, with appendiceal mucosal enhancement and mild periappendiceal fat stranding consistent with acute uncomplicated appendicitis. No perforation, fluid collection, or abscess. No bowel obstruction or ileus. Vascular/Lymphatic: No significant vascular findings are present. No enlarged abdominal or pelvic lymph nodes. Reproductive: Prostate is unremarkable. Other: No free fluid or free intraperitoneal gas. No abdominal wall hernia. Musculoskeletal: No acute or destructive bony lesions. Reconstructed images demonstrate no additional findings. IMPRESSION: 1. Acute uncomplicated appendicitis. No perforation, fluid collection, or abscess. Electronically Signed   By: Sharlet Salina M.D.   On: 01/19/2022 21:43    Review of Systems  Constitutional: Negative.   HENT: Negative.    Eyes: Negative.   Respiratory: Negative.    Cardiovascular: Negative.   Gastrointestinal:  Positive for abdominal pain, nausea and vomiting.  Endocrine: Negative.   Genitourinary: Negative.   Musculoskeletal: Negative.   Allergic/Immunologic: Negative.   Neurological: Negative.    Hematological: Negative.   Psychiatric/Behavioral: Negative.      Blood pressure 122/67, pulse 77, temperature 99.9 F (37.7 C), temperature source Oral, resp. rate 16, height 6\' 2"  (1.88 m), weight 72.6 kg, SpO2 99 %. Physical Exam Vitals reviewed.  Constitutional:      General: He is not in acute distress.    Appearance: Normal appearance. He is normal weight.  HENT:     Head: Normocephalic and atraumatic.     Right Ear: External ear normal.     Left Ear: External ear normal.     Nose: Nose normal.     Mouth/Throat:     Mouth: Mucous membranes are moist.     Pharynx: Oropharynx is clear.  Eyes:     General: No scleral icterus.    Extraocular Movements: Extraocular movements intact.     Conjunctiva/sclera: Conjunctivae normal.     Pupils: Pupils are equal, round, and reactive to  light.  Cardiovascular:     Rate and Rhythm: Normal rate and regular rhythm.     Pulses: Normal pulses.     Heart sounds: Normal heart sounds.  Abdominal:     General: Abdomen is flat. Bowel sounds are normal.     Palpations: Abdomen is soft.     Comments: There is mild tenderness RLQ  Musculoskeletal:        General: No swelling or deformity. Normal range of motion.     Cervical back: Normal range of motion and neck supple.  Skin:    General: Skin is warm and dry.  Neurological:     General: No focal deficit present.     Mental Status: He is alert and oriented to person, place, and time.  Psychiatric:        Mood and Affect: Mood normal.        Behavior: Behavior normal.      Assessment/Plan The patient appears to have acute appendicitis.  Because of the risk of perforation and sepsis I feel he would benefit from having his appendix removed.  He would also like to have this done.  I have discussed with him in detail the risks and benefits of the operation as well as some of the technical aspects and he understands and wishes to proceed.  We will start him on broad-spectrum antibiotic therapy  and plan for surgery this morning  Chevis Pretty III, MD 01/20/2022, 6:28 AM

## 2022-01-20 NOTE — Progress Notes (Signed)
Pt discharged. Discharge instructions given. Pt wheeled down to waiting area with pt's mother.

## 2022-01-20 NOTE — ED Provider Notes (Signed)
Patient transferred from [facility] for acute appendicitis on imaging.  He has been NPO.  At time of arrival to the [facility] emergency department patient was pain-free.  He was placed on maintenance IV fluids.  Surgery notified of patient's arrival.  During his ED stay he did develop some recurrent pain, episode of emesis that was treated with additional morphine and ondansetron.   [name], MD 01/20/22 [phone]

## 2022-01-20 NOTE — Transfer of Care (Signed)
Immediate Anesthesia Transfer of Care Note  Patient: Daniel Rubio  Procedure(s) Performed: APPENDECTOMY LAPAROSCOPIC (Abdomen)  Patient Location: PACU  Anesthesia Type:General  Level of Consciousness: awake, alert , and oriented  Airway & Oxygen Therapy: Patient Spontanous Breathing and Patient connected to face mask oxygen  Post-op Assessment: Report given to RN, Post -op Vital signs reviewed and stable, and Patient moving all extremities X 4  Post vital signs: Reviewed and stable  Last Vitals:  Vitals Value Taken Time  BP 127/63   Temp    Pulse 102 01/20/22 0854  Resp 20 01/20/22 0854  SpO2 100 % 01/20/22 0854  Vitals shown include unvalidated device data.  Last Pain:  Vitals:   01/20/22 0630  TempSrc: Oral  PainSc:          Complications: No notable events documented.

## 2022-01-20 NOTE — Op Note (Signed)
01/19/2022 - 01/20/2022  8:46 AM  PATIENT:  Daniel Rubio  22 y.o. male  PRE-OPERATIVE DIAGNOSIS:  ACUTE APPENDICITIS  POST-OPERATIVE DIAGNOSIS:  ACUTE APPENDICITIS  PROCEDURE:  Procedure(s): APPENDECTOMY LAPAROSCOPIC (N/A)  SURGEON:  Surgeon(s) and Role:    * Griselda Miner, MD - Primary  PHYSICIAN ASSISTANT:   ASSISTANTS: none   ANESTHESIA:   local and general  EBL:  minimal   BLOOD ADMINISTERED:none  DRAINS: none   LOCAL MEDICATIONS USED:  MARCAINE     SPECIMEN:  Source of Specimen:  appendix  DISPOSITION OF SPECIMEN:  PATHOLOGY  COUNTS:  YES  TOURNIQUET:  * No tourniquets in log *  DICTATION: .Dragon Dictation  After informed consent was obtained patient was brought to the operating room placed in the supine position on the operating room table. After adequate induction of general anesthesia the patient's abdomen was prepped with ChloraPrep, allowed to dry, and draped in usual sterile manner. The area below the umbilicus was infiltrated with quarter percent Marcaine. A small incision was made with a 15 blade knife. This incision was carried down through the subcutaneous tissue bluntly with a hemostat and [employer] retractors until the linea alba was identified. The linea alba was incised with a 15 blade knife. Each side was grasped Coker clamps and elevated anteriorly. The preperitoneal space was probed bluntly with a hemostat until the peritoneum was opened and access was gained to the abdominal cavity. A 0 Vicryl purse string stitch was placed in the fascia surrounding the opening. A Hassan cannula was placed through the opening and anchored in place with the previously placed Vicryl purse string stitch. The laparoscope was placed through the [facility] cannula. The abdomen was insufflated with carbon dioxide without difficulty. Next the suprapubic area was infiltrated with quarter percent Marcaine. A small incision was made with a 15 blade knife. A 5 mm port was placed  bluntly through this incision into the abdominal cavity. A site was then chosen between the 2 port for placement of a 5 mm port. The area was infiltrated with quarter percent Marcaine. A small stab incision was made with a 15 blade knife. A 5 mm port was placed bluntly through this incision and the abdominal cavity under direct vision. The laparoscope was then moved to the suprapubic port. Using a Glassman grasper and harmonic scalpel the right lower quadrant was inspected. The appendix was readily identified. The appendix was elevated anteriorly and the mesoappendix was taken down sharply with the harmonic scalpel. Once the base of the appendix where it joined the cecum was identified and cleared of any tissue then a laparoscopic Echelon stapler with a black load was placed through the [facility] cannula. The stapler was placed across the base of the appendix clamped and fired thereby dividing the base of the appendix between staple lines. A laparoscopic bag was then inserted through the [facility] cannula. The appendix was placed within the bag and the bag was sealed. The abdomen was then irrigated with copious amounts of saline until the effluent was clear. No other abnormalities were noted. The appendix and bag were removed with the [facility] cannula through the infraumbilical port without difficulty. The fascial defect was closed with the previously placed Vicryl pursestring stitch as well as with another interrupted 0 Vicryl figure-of-eight stitch. The rest of the ports were removed under direct vision and were found to be hemostatic. The gas was allowed to escape. The skin incisions were closed with interrupted 4-0 Monocryl subcuticular stitches. Dermabond dressings were applied.  The patient tolerated the procedure well. At the end of the case all needle sponge and instrument counts were correct. The patient was then awakened and taken to recovery in stable condition.  PLAN OF CARE: Admit for overnight  observation  PATIENT DISPOSITION:  PACU - hemodynamically stable.   Delay start of Pharmacological VTE agent (>24hrs) due to surgical blood loss or risk of bleeding: no

## 2022-01-20 NOTE — Anesthesia Preprocedure Evaluation (Addendum)
Anesthesia Evaluation  Patient identified by MRN, date of birth, ID band Patient awake    Reviewed: Allergy & Precautions, NPO status , Patient's Chart, lab work & pertinent test results  History of Anesthesia Complications Negative for: history of anesthetic complications  Airway Mallampati: I  TM Distance: >3 FB Neck ROM: Full    Dental  (+) Dental Advisory Given   Pulmonary Current Smoker and Patient abstained from smoking.   breath sounds clear to auscultation       Cardiovascular negative cardio ROS  Rhythm:Regular Rate:Normal     Neuro/Psych negative neurological ROS  negative psych ROS   GI/Hepatic ,,,(+)     substance abuse  marijuana useN/v with acute appy   Endo/Other  negative endocrine ROS    Renal/GU negative Renal ROS     Musculoskeletal   Abdominal   Peds  Hematology   Anesthesia Other Findings   Reproductive/Obstetrics                             Anesthesia Physical Anesthesia Plan  ASA: 2  Anesthesia Plan: General   Post-op Pain Management: Ofirmev IV (intra-op)*   Induction: Intravenous and Rapid sequence  PONV Risk Score and Plan: 1 and Ondansetron and Dexamethasone  Airway Management Planned: Oral ETT  Additional Equipment: PA Cath  Intra-op Plan:   Post-operative Plan: Extubation in OR  Informed Consent: I have reviewed the patients History and Physical, chart, labs and discussed the procedure including the risks, benefits and alternatives for the proposed anesthesia with the patient or authorized representative who has indicated his/her understanding and acceptance.     Dental advisory given  Plan Discussed with: CRNA and Surgeon  Anesthesia Plan Comments:        Anesthesia Quick Evaluation

## 2022-01-20 NOTE — Anesthesia Procedure Notes (Signed)
Procedure Name: Intubation Date/Time: 01/20/2022 7:51 AM  Performed by: Niel Hummer, CRNAPre-anesthesia Checklist: Patient identified, Emergency Drugs available, Suction available and Patient being monitored Patient Re-evaluated:Patient Re-evaluated prior to induction Oxygen Delivery Method: Circle system utilized Preoxygenation: Pre-oxygenation with 100% oxygen Induction Type: IV induction, Rapid sequence and Cricoid Pressure applied Laryngoscope Size: Mac and 4 Grade View: Grade I Tube type: Oral Tube size: 7.5 mm Number of attempts: 1 Airway Equipment and Method: Stylet Placement Confirmation: ETT inserted through vocal cords under direct vision, positive ETCO2 and breath sounds checked- equal and bilateral Secured at: 23 cm Tube secured with: Tape Dental Injury: Teeth and Oropharynx as per pre-operative assessment

## 2022-01-20 NOTE — Anesthesia Postprocedure Evaluation (Signed)
Anesthesia Post Note  Patient: Daniel Rubio  Procedure(s) Performed: APPENDECTOMY LAPAROSCOPIC (Abdomen)     Patient location during evaluation: PACU Anesthesia Type: General Level of consciousness: awake and alert, patient cooperative and oriented Pain management: pain level controlled Vital Signs Assessment: post-procedure vital signs reviewed and stable Respiratory status: spontaneous breathing, nonlabored ventilation and respiratory function stable Cardiovascular status: blood pressure returned to baseline and stable Postop Assessment: no apparent nausea or vomiting and adequate PO intake Anesthetic complications: no   No notable events documented.  Last Vitals:  Vitals:   01/20/22 0915 01/20/22 0930  BP: 124/74 123/72  Pulse: 68 69  Resp: 16 15  Temp:    SpO2: 100% 100%    Last Pain:  Vitals:   01/20/22 0930  TempSrc:   PainSc: 2                  Aleira Deiter,E. Haide Klinker

## 2022-01-22 ENCOUNTER — Encounter (HOSPITAL_COMMUNITY): Payer: Self-pay | Admitting: General Surgery

## 2022-01-23 LAB — SURGICAL PATHOLOGY

## 2022-11-28 ENCOUNTER — Encounter (HOSPITAL_BASED_OUTPATIENT_CLINIC_OR_DEPARTMENT_OTHER): Payer: Self-pay | Admitting: Emergency Medicine

## 2022-11-28 ENCOUNTER — Other Ambulatory Visit: Payer: Self-pay

## 2022-11-28 ENCOUNTER — Emergency Department (HOSPITAL_BASED_OUTPATIENT_CLINIC_OR_DEPARTMENT_OTHER)
Admission: EM | Admit: 2022-11-28 | Discharge: 2022-11-28 | Disposition: A | Discharge status: Home / Self Care | Payer: Medicaid Other | Attending: Emergency Medicine | Admitting: Emergency Medicine

## 2022-11-28 DIAGNOSIS — K0889 Other specified disorders of teeth and supporting structures: Secondary | ICD-10-CM | POA: Diagnosis present

## 2022-11-28 DIAGNOSIS — K029 Dental caries, unspecified: Secondary | ICD-10-CM | POA: Diagnosis not present

## 2022-11-28 MED ORDER — IBUPROFEN 600 MG PO TABS: 600.0000 mg | ORAL_TABLET | Freq: Four times a day (QID) | ORAL | 0 refills | Status: AC | PRN

## 2022-11-28 MED ORDER — AMOXICILLIN 500 MG PO CAPS: 1000.0000 mg | ORAL_CAPSULE | Freq: Two times a day (BID) | ORAL | 0 refills | Status: AC

## 2022-11-28 MED ORDER — IBUPROFEN 400 MG PO TABS
600.0000 mg | ORAL_TABLET | Freq: Once | ORAL | Status: AC
  Administered 2022-11-28: 600 mg via ORAL
  Filled 2022-11-28: qty 1

## 2022-11-28 MED ORDER — OXYCODONE-ACETAMINOPHEN 5-325 MG PO TABS
1.0000 | ORAL_TABLET | Freq: Once | ORAL | Status: AC
  Administered 2022-11-28: 1 via ORAL
  Filled 2022-11-28: qty 1

## 2022-11-28 MED ORDER — AMOXICILLIN 500 MG PO CAPS
1000.0000 mg | ORAL_CAPSULE | Freq: Once | ORAL | Status: AC
  Administered 2022-11-28: 1000 mg via ORAL
  Filled 2022-11-28: qty 2

## 2022-11-28 NOTE — Discharge Instructions (Signed)
Make an appointment with your dentist for further definitive care of dental pain. Take the medications as prescribed.

## 2022-11-28 NOTE — ED Provider Notes (Signed)
[facility] EMERGENCY DEPARTMENT AT [facility] Provider Note   CSN: [id number] Arrival date & time: 11/28/22  1342     History  Chief Complaint  Patient presents with   Dental Pain    Daniel Rubio is a 22 y.o. male.  Patient to ED with severe lower right molar pain that has been a dental problem in the past but never with pain this severe. No facial swelling or drainage into the mouth. No sore throat or trouble swallowing. No nausea or vomiting. No fever.   The history is provided by the patient. No language interpreter was used.  Dental Pain      Home Medications Prior to Admission medications   Medication Sig Start Date End Date Taking? Authorizing Provider  amoxicillin (AMOXIL) 500 MG capsule Take 2 capsules (1,000 mg total) by mouth 2 (two) times daily. 11/28/22  Yes August Gosser, Melvenia Beam, PA-C  ibuprofen (ADVIL) 600 MG tablet Take 1 tablet (600 mg total) by mouth every 6 (six) hours as needed. 11/28/22  Yes Ceejay Kegley, Melvenia Beam, PA-C  oxyCODONE (ROXICODONE) 5 MG immediate release tablet Take 1 tablet (5 mg total) by mouth every 6 (six) hours as needed for severe pain. 01/20/22   Griselda Miner, MD      Allergies    Patient has no known allergies.    Review of Systems   Review of Systems  Physical Exam Updated Vital Signs BP (!) 155/74 (BP Location: Right Arm)   Pulse (!) 54   Temp 98.1 F (36.7 C)   Resp 18   Ht 6' (1.829 m)   Wt 77.1 kg   SpO2 100%   BMI 23.06 kg/m  Physical Exam Vitals and nursing note reviewed.  Constitutional:      Appearance: Normal appearance.     Comments: Pacing, uncomfortable  HENT:     Nose: Nose normal.     Mouth/Throat:     Mouth: Mucous membranes are moist.     Comments: Generally healthy dentition. There is decay at the base of #29. No visualized abscess. No facial swelling. No submental adenopathy.  Cardiovascular:     Rate and Rhythm: Normal rate.  Pulmonary:     Effort: Pulmonary effort is normal.  Skin:     General: Skin is warm and dry.  Neurological:     Mental Status: He is alert.     ED Results / Procedures / Treatments   Labs (all labs ordered are listed, but only abnormal results are displayed) Labs Reviewed - No data to display  EKG None  Radiology No results found.  Procedures Procedures    Medications Ordered in ED Medications  oxyCODONE-acetaminophen (PERCOCET/ROXICET) 5-325 MG per tablet 1 tablet (1 tablet Oral Given 11/28/22 1444)  ibuprofen (ADVIL) tablet 600 mg (600 mg Oral Given 11/28/22 1443)  amoxicillin (AMOXIL) capsule 1,000 mg (1,000 mg Oral Given 11/28/22 1443)    ED Course/ Medical Decision Making/ A&P Clinical Course as of 11/28/22 1445  Wed Nov 28, 2022  1442 Severe dental pain this morning to #29 with decay at the tooth base. No visualized abscess amenable to drainage. Start Amoxil. He is in contact with dentistry for an appointment for definitive care.  [SU]    Clinical Course User Index [SU] Elpidio Anis, PA-C                                 Medical Decision Making Risk Prescription drug management.  Final Clinical Impression(s) / ED Diagnoses Final diagnoses:  Dental decay    Rx / DC Orders ED Discharge Orders          Ordered    ibuprofen (ADVIL) 600 MG tablet  Every 6 hours PRN        11/28/22 1444    amoxicillin (AMOXIL) 500 MG capsule  2 times daily        11/28/22 1444              Elpidio Anis, PA-C 11/28/22 1445    Ernie Avena, MD 11/28/22 1529

## 2022-11-28 NOTE — ED Notes (Signed)

## 2022-11-28 NOTE — ED Triage Notes (Signed)
Pt via pov from home with dental pain today. Pt reports that he is waiting for his dentist office to call him back. Pt alert & oriented, nad noted.
